# Patient Record
Sex: Female | Born: 1955 | Race: White | Hispanic: No
Health system: Southern US, Community
[De-identification: ages and names within clinical notes are randomized; demographics above are authoritative.]

---

## 2014-04-08 ENCOUNTER — Ambulatory Visit: Payer: Self-pay | Admitting: Nurse Practitioner

## 2015-10-01 ENCOUNTER — Other Ambulatory Visit: Payer: Self-pay | Admitting: Nurse Practitioner

## 2015-10-01 DIAGNOSIS — Z1231 Encounter for screening mammogram for malignant neoplasm of breast: Secondary | ICD-10-CM

## 2015-10-15 ENCOUNTER — Ambulatory Visit
Admission: RE | Admit: 2015-10-15 | Discharge: 2015-10-15 | Disposition: A | Discharge status: Home / Self Care | Payer: BLUE CROSS/BLUE SHIELD | Source: Ambulatory Visit | Attending: Nurse Practitioner | Admitting: Nurse Practitioner

## 2015-10-15 DIAGNOSIS — Z1231 Encounter for screening mammogram for malignant neoplasm of breast: Secondary | ICD-10-CM | POA: Diagnosis present

## 2018-02-12 ENCOUNTER — Other Ambulatory Visit: Payer: Self-pay | Admitting: Internal Medicine

## 2018-02-12 DIAGNOSIS — Z1231 Encounter for screening mammogram for malignant neoplasm of breast: Secondary | ICD-10-CM

## 2020-07-03 ENCOUNTER — Other Ambulatory Visit: Payer: Self-pay | Admitting: Otolaryngology

## 2020-07-03 DIAGNOSIS — K118 Other diseases of salivary glands: Secondary | ICD-10-CM

## 2020-07-06 ENCOUNTER — Ambulatory Visit
Admission: RE | Admit: 2020-07-06 | Discharge: 2020-07-06 | Disposition: A | Discharge status: Home / Self Care | Payer: BLUE CROSS/BLUE SHIELD | Source: Ambulatory Visit | Attending: Otolaryngology | Admitting: Otolaryngology

## 2020-07-06 ENCOUNTER — Other Ambulatory Visit: Payer: Self-pay

## 2020-07-06 DIAGNOSIS — D110 Benign neoplasm of parotid gland: Secondary | ICD-10-CM | POA: Diagnosis not present

## 2020-07-06 DIAGNOSIS — K118 Other diseases of salivary glands: Secondary | ICD-10-CM

## 2020-07-06 DIAGNOSIS — R221 Localized swelling, mass and lump, neck: Secondary | ICD-10-CM | POA: Diagnosis present

## 2020-07-06 NOTE — Discharge Instructions (Signed)

## 2020-07-06 NOTE — Procedures (Signed)
Pre Procedure Dx: Left parotid nodule Post Procedural Dx: Same  Technically successful US guided biopsy of indeterminate left parotid nodule.   EBL: None No immediate complications.   Ronny Bacon, MD Pager #: [phone]

## 2020-07-10 LAB — SURGICAL PATHOLOGY

## 2020-07-11 ENCOUNTER — Other Ambulatory Visit: Payer: Self-pay | Admitting: Otolaryngology

## 2020-07-11 DIAGNOSIS — R221 Localized swelling, mass and lump, neck: Secondary | ICD-10-CM

## 2020-07-20 ENCOUNTER — Ambulatory Visit: Admission: RE | Admit: 2020-07-20 | Payer: BLUE CROSS/BLUE SHIELD | Source: Ambulatory Visit

## 2022-08-23 IMAGING — US IR BIOPSY CORE SALIVARY GLAND
1 series · 12 of 12 positions shown · non-contrast
Comparison: Soft tissue neck ultrasound-earlier same day

INDICATION: Indeterminate left parotid gland nodule. Please perform
ultrasound-guided biopsy for tissue diagnostic purposes.

EXAM:
ULTRASOUND-GUIDED LEFT PAROTID NODULE BIOPSY
TECHNIQUE: Informed written consent was obtained from the patient after a
discussion of the risks, benefits and alternatives to treatment.
Questions regarding the procedure were encouraged and answered.
Initial ultrasound scanning demonstrated unchanged size and
appearance of the liver approximately 2.4 x 1.5 x 1.4 cm bilobed
hypoechoic nodule within the posterior aspect of left parotid
gland. An ultrasound image was saved for documentation purposes.
The procedure was planned. A timeout was performed prior to the
initiation of the procedure.

[Series 1: ir biopsy core salivary gland · 0.04mm/px · 12 of 12 slices shown]
[im 1/12]
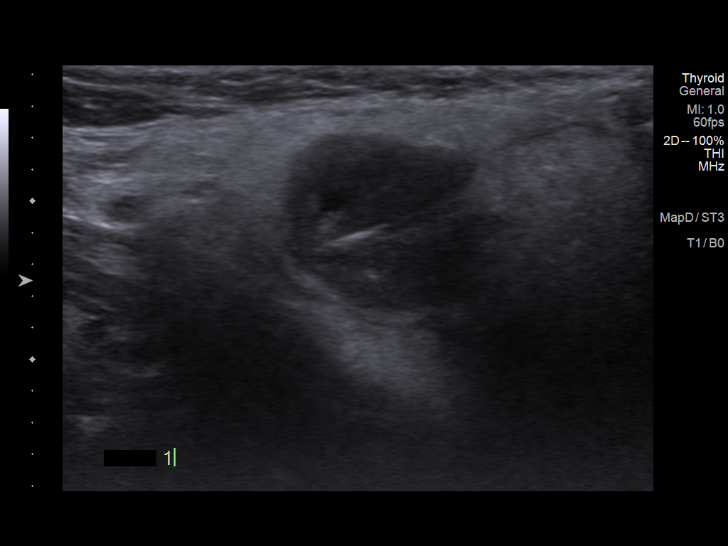
[im 2/12]
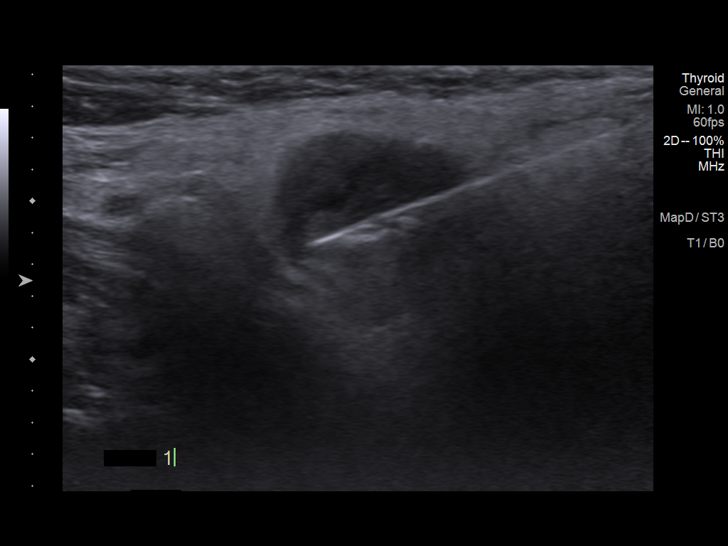
[im 3/12]
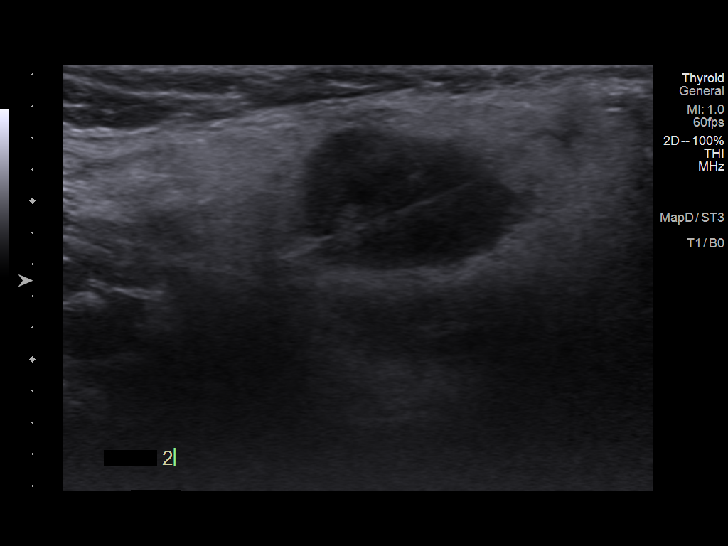
[im 4/12]
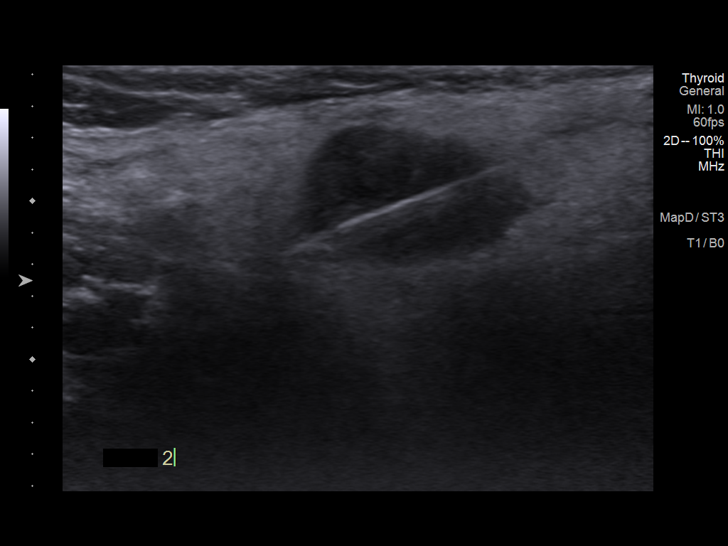
[im 5/12]
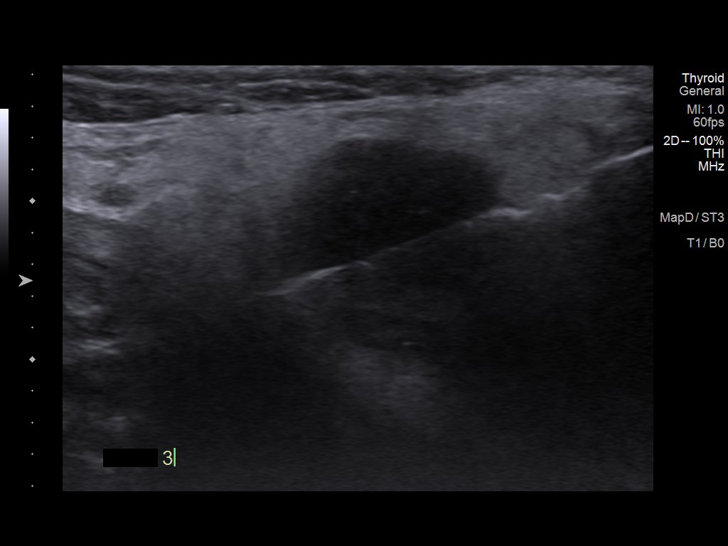
[im 6/12]
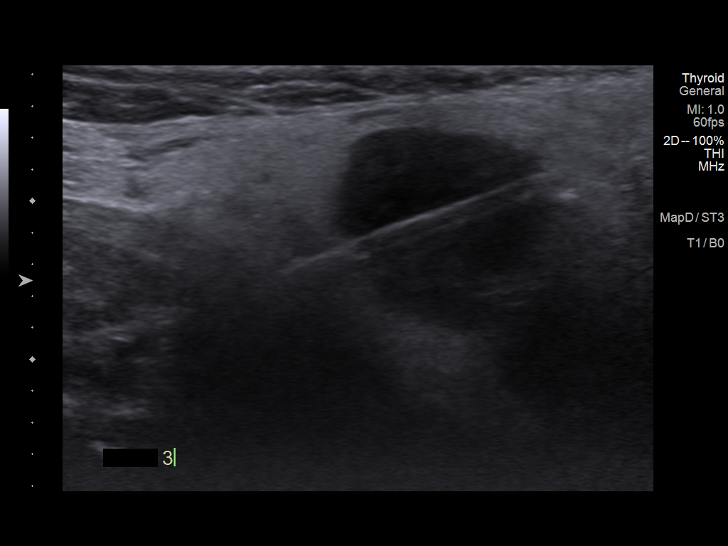
[im 7/12]
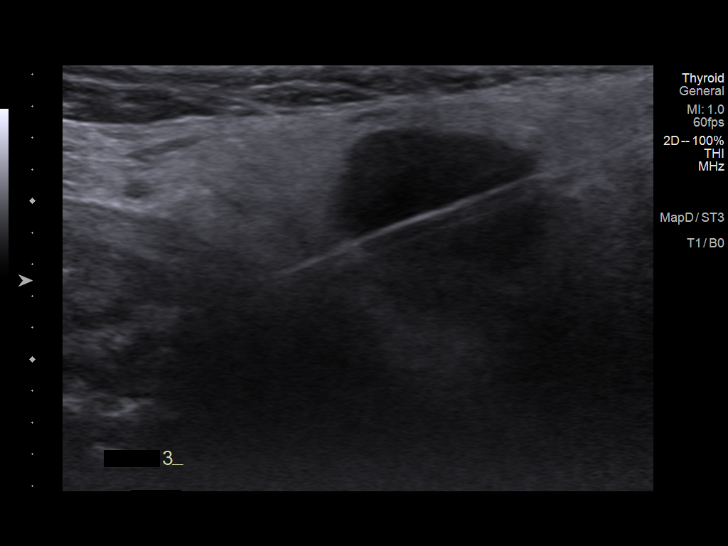
[im 8/12]
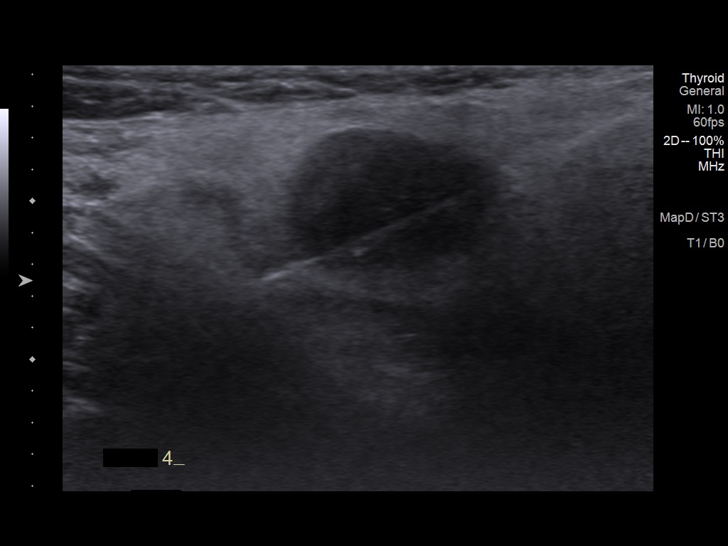
[im 9/12]
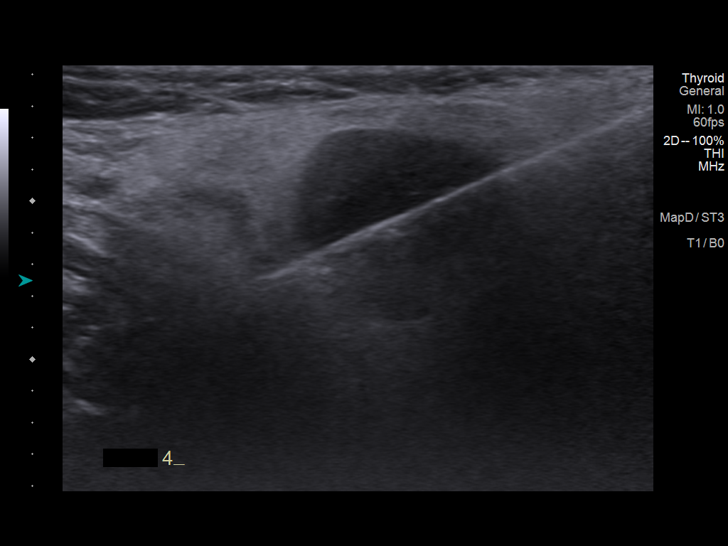
[im 10/12]
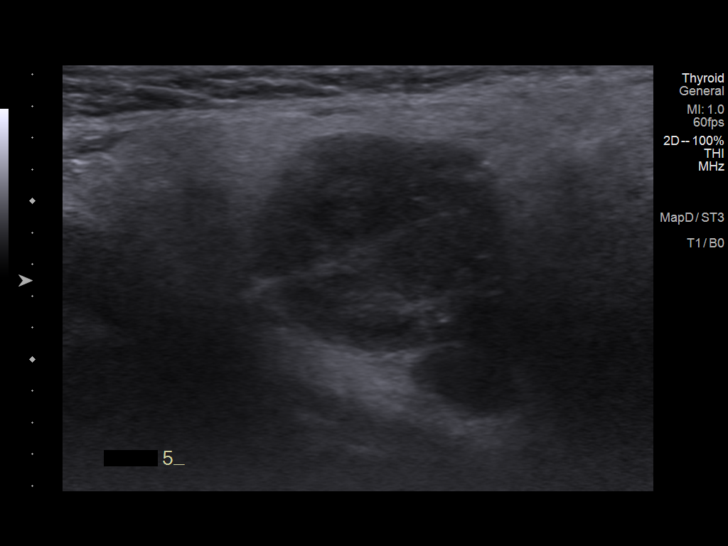
[im 11/12]
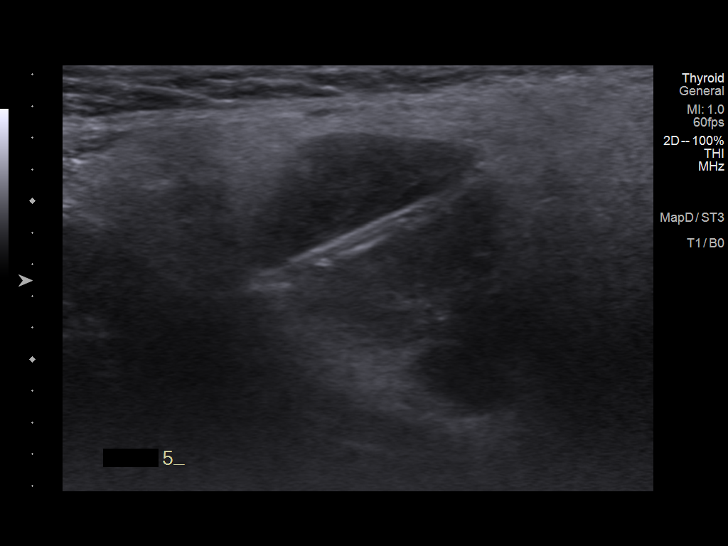
[im 12/12]
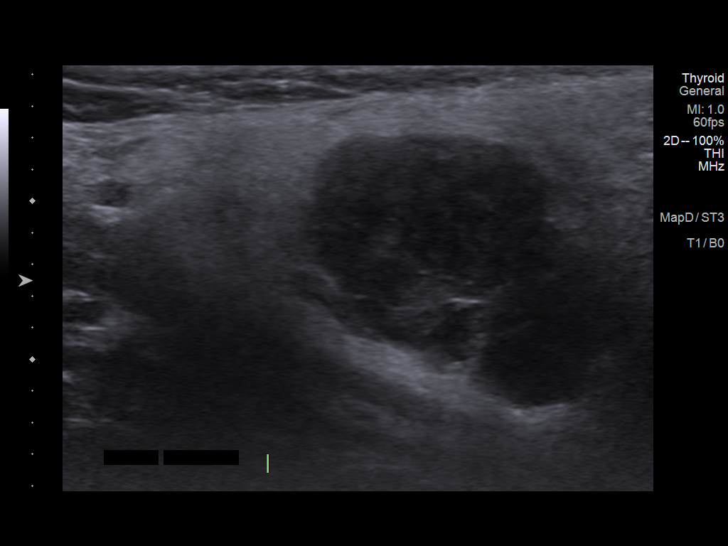

[12 of 12 positions shown; findings below may reference images not displayed]

MEDICATIONS:
None

ANESTHESIA/SEDATION:
None, per patient request

COMPLICATIONS:
None immediate.
The operative was prepped and draped in the usual sterile fashion,
and a sterile drape was applied covering the operative field. A
timeout was performed prior to the initiation of the procedure.
Local anesthesia was provided with 1% lidocaine with epinephrine.

Under direct ultrasound guidance, an 18 gauge core needle device was
utilized to obtain to obtain 5 core needle biopsies of the
indeterminate left parotid gland nodule.

The samples were placed in saline and submitted to pathology. The
needle was removed and hemostasis was achieved with manual
compression. Post procedure scan was negative for significant
hematoma. A dressing was placed. The patient tolerated the procedure
well without immediate postprocedural complication.
IMPRESSION: Technically successful ultrasound guided biopsy of indeterminate
left parotid gland nodule.

## 2022-08-23 IMAGING — US US SOFT TISSUE HEAD/NECK
1 series · 10 of 10 positions shown · non-contrast
Comparison: None.

CLINICAL DATA: Palpable left-sided parotid nodule.

EXAM:
ULTRASOUND OF HEAD/NECK SOFT TISSUES
TECHNIQUE: Ultrasound examination of the head and neck soft tissues was
performed in the area of clinical concern.

[Series 1: us soft tissue head/neck · 0.04mm/px · 10 of 10 slices shown]
[im 1/10]
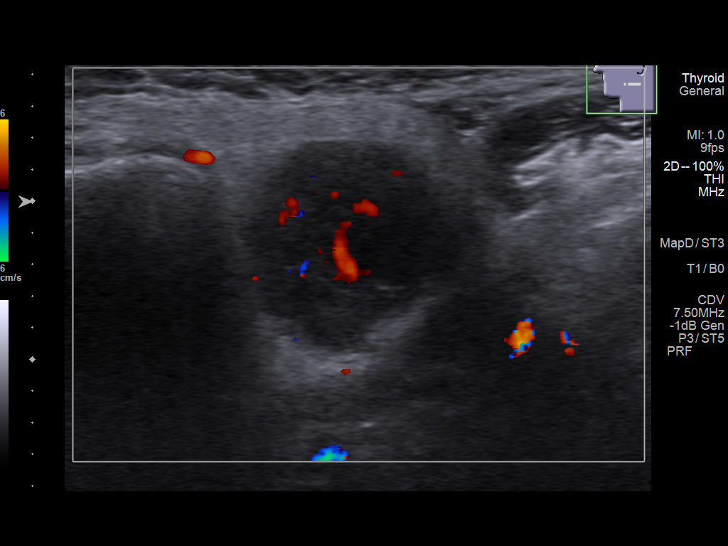
[im 2/10]
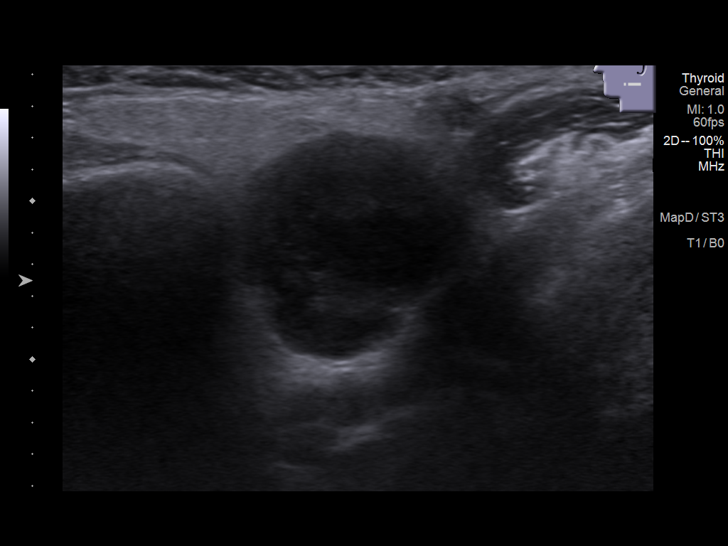
[im 3/10]
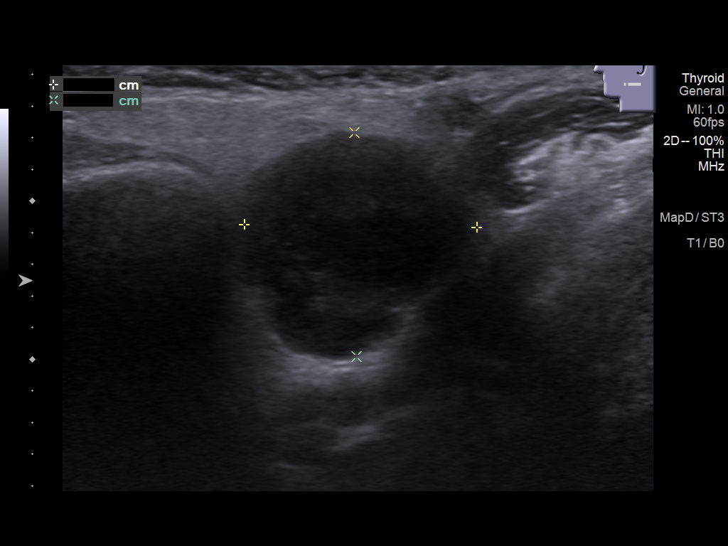
[im 4/10]
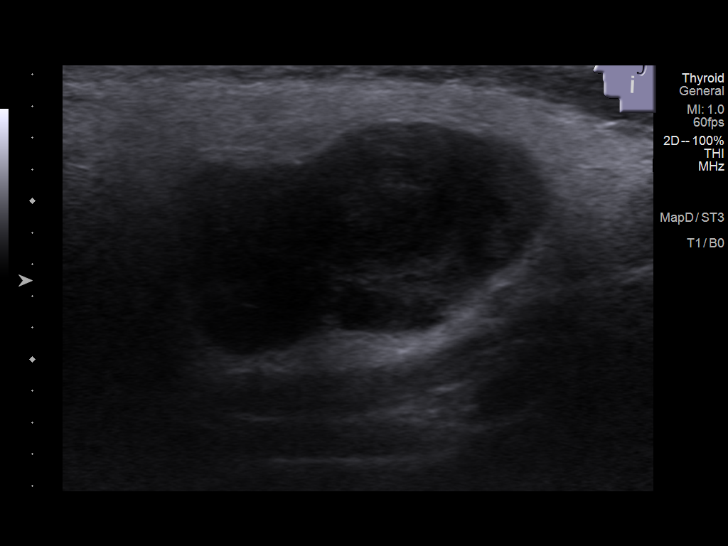
[im 5/10]
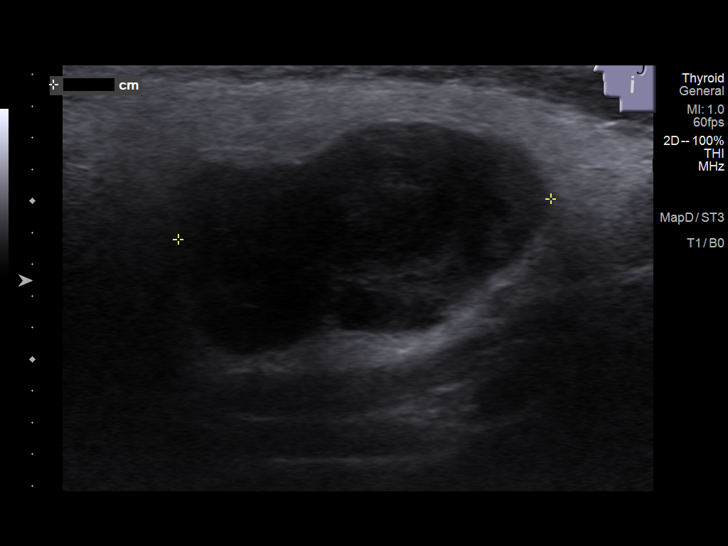
[im 6/10]
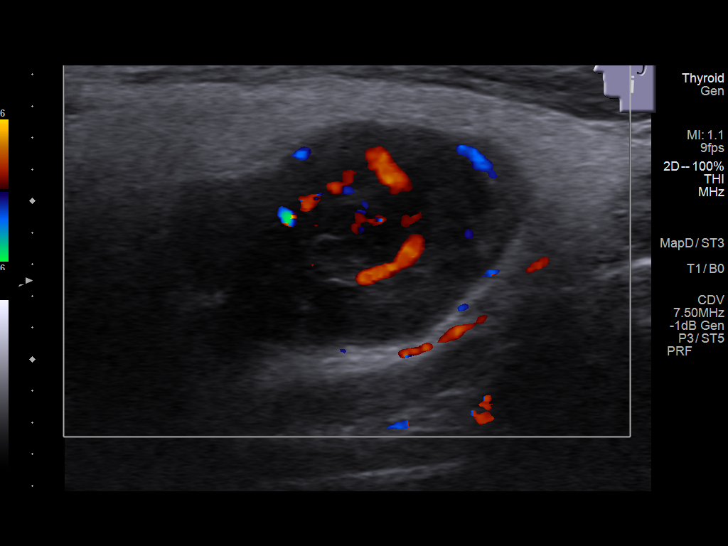
[im 7/10]
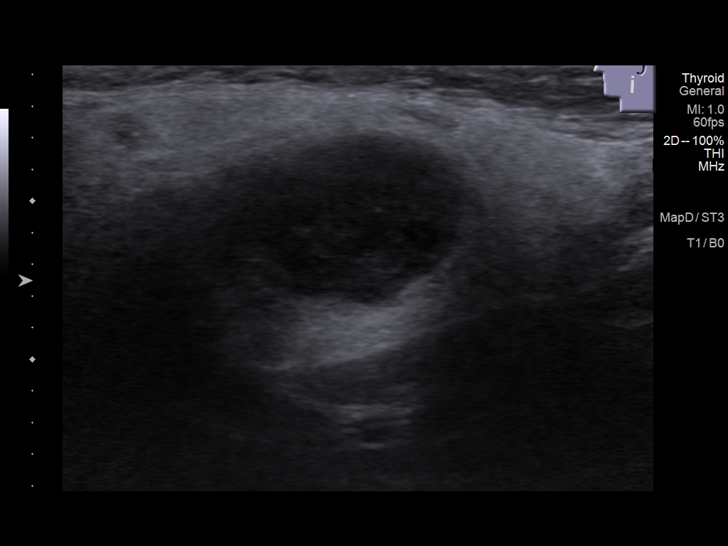
[im 8/10]
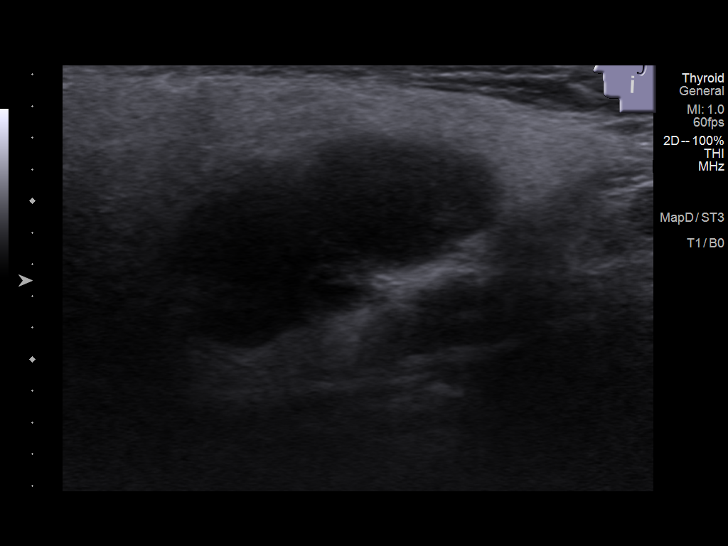
[im 9/10]
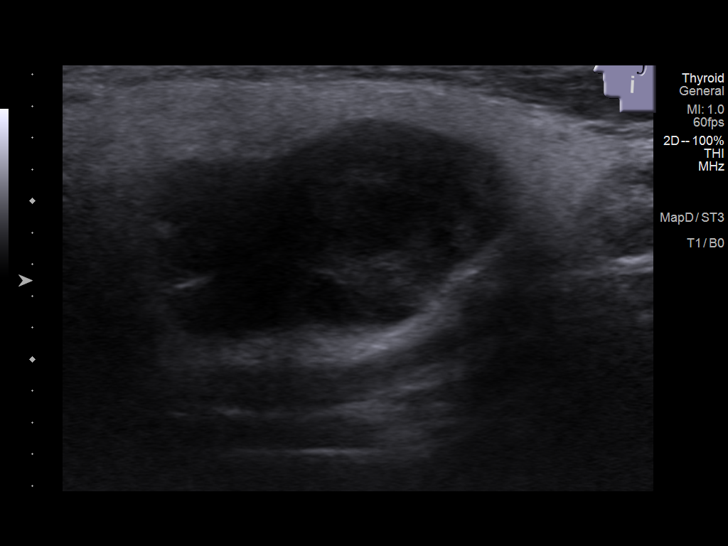
[im 10/10]
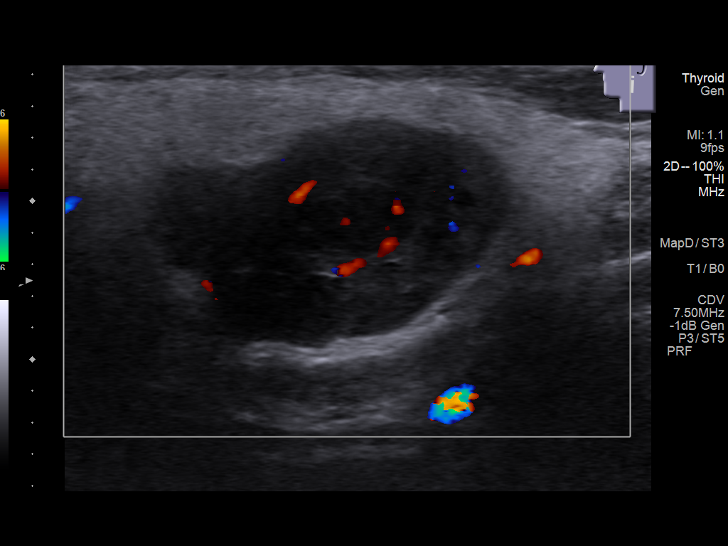

[10 of 10 positions shown; findings below may reference images not displayed]

FINDINGS: Sonographic evaluation of the patient's palpable area of concern
demonstrates an approximately 2.4 x 1.5 x 1.4 cm bilobed hypoechoic
nodule within the left parotid gland. No definitive ductal
dilatation.

There are no additional sonographic correlate for the patient's
palpable area of concern. Specifically, no regional cervical
lymphadenopathy.
IMPRESSION: Patient's palpable area of concern involving the left neck
correlates with an approximately 2.4 cm indeterminate hypoechoic
nodule within left parotid gland - potentially an intraparotid lymph
node though a discrete parotid lesion (such as a Warthin's tumor) is
not excluded on the basis of this examination.

The patient subsequently underwent ultrasound-guided core needle
biopsy of this indeterminate parotid nodule.

## 2023-05-25 ENCOUNTER — Other Ambulatory Visit: Payer: Self-pay | Admitting: Internal Medicine

## 2023-05-25 DIAGNOSIS — Z1231 Encounter for screening mammogram for malignant neoplasm of breast: Secondary | ICD-10-CM

## 2023-06-12 ENCOUNTER — Other Ambulatory Visit: Payer: Self-pay | Admitting: Internal Medicine

## 2023-06-12 ENCOUNTER — Ambulatory Visit
Admission: RE | Admit: 2023-06-12 | Discharge: 2023-06-12 | Disposition: A | Discharge status: Home / Self Care | Payer: No Typology Code available for payment source | Source: Ambulatory Visit | Attending: Internal Medicine | Admitting: Internal Medicine

## 2023-06-12 DIAGNOSIS — M25561 Pain in right knee: Secondary | ICD-10-CM

## 2023-06-17 ENCOUNTER — Other Ambulatory Visit: Payer: Self-pay | Admitting: Internal Medicine

## 2023-06-17 DIAGNOSIS — Z13820 Encounter for screening for osteoporosis: Secondary | ICD-10-CM

## 2023-09-02 ENCOUNTER — Other Ambulatory Visit: Payer: No Typology Code available for payment source
